# Patient Record
Sex: Male | Born: 1995 | Hispanic: No | Marital: Single | Smoking: Current every day smoker
Health system: Southern US, Community
[De-identification: ages and names within clinical notes are randomized; demographics above are authoritative.]

---

## 2014-06-12 ENCOUNTER — Ambulatory Visit: Payer: Self-pay

## 2014-07-17 ENCOUNTER — Encounter: Payer: Self-pay | Admitting: Family Medicine

## 2014-07-17 ENCOUNTER — Other Ambulatory Visit (HOSPITAL_COMMUNITY)
Admission: RE | Admit: 2014-07-17 | Discharge: 2014-07-17 | Disposition: A | Discharge status: Home / Self Care | Payer: Medicaid Other | Source: Ambulatory Visit | Attending: Family Medicine | Admitting: Family Medicine

## 2014-07-17 ENCOUNTER — Ambulatory Visit (INDEPENDENT_AMBULATORY_CARE_PROVIDER_SITE_OTHER): Payer: Medicaid Other | Admitting: Family Medicine

## 2014-07-17 VITALS — BP 112/61 | HR 64 | Temp 98.2°F | Ht 65.0 in | Wt 148.4 lb

## 2014-07-17 DIAGNOSIS — Z008 Encounter for other general examination: Secondary | ICD-10-CM

## 2014-07-17 DIAGNOSIS — Z716 Tobacco abuse counseling: Secondary | ICD-10-CM | POA: Insufficient documentation

## 2014-07-17 DIAGNOSIS — K089 Disorder of teeth and supporting structures, unspecified: Secondary | ICD-10-CM | POA: Insufficient documentation

## 2014-07-17 DIAGNOSIS — Z0289 Encounter for other administrative examinations: Secondary | ICD-10-CM | POA: Insufficient documentation

## 2014-07-17 DIAGNOSIS — K088 Other specified disorders of teeth and supporting structures: Secondary | ICD-10-CM

## 2014-07-17 DIAGNOSIS — Z603 Acculturation difficulty: Secondary | ICD-10-CM | POA: Insufficient documentation

## 2014-07-17 DIAGNOSIS — Z23 Encounter for immunization: Secondary | ICD-10-CM

## 2014-07-17 DIAGNOSIS — Z113 Encounter for screening for infections with a predominantly sexual mode of transmission: Secondary | ICD-10-CM | POA: Diagnosis not present

## 2014-07-17 LAB — POCT URINALYSIS DIPSTICK
Bilirubin, UA: NEGATIVE
Blood, UA: NEGATIVE
Glucose, UA: NEGATIVE
Ketones, UA: NEGATIVE
Leukocytes, UA: NEGATIVE
NITRITE UA: NEGATIVE
PROTEIN UA: NEGATIVE
SPEC GRAV UA: 1.015
UROBILINOGEN UA: 0.2
pH, UA: 7.5

## 2014-07-17 LAB — CBC WITH DIFFERENTIAL/PLATELET
BASOS ABS: 0.1 10*3/uL (ref 0.0–0.1)
BASOS PCT: 1 % (ref 0–1)
EOS PCT: 4 % (ref 0–5)
Eosinophils Absolute: 0.3 10*3/uL (ref 0.0–0.7)
HEMATOCRIT: 44.8 % (ref 39.0–52.0)
Hemoglobin: 15.4 g/dL (ref 13.0–17.0)
LYMPHS PCT: 36 % (ref 12–46)
Lymphs Abs: 2.6 10*3/uL (ref 0.7–4.0)
MCH: 29.8 pg (ref 26.0–34.0)
MCHC: 34.4 g/dL (ref 30.0–36.0)
MCV: 86.8 fL (ref 78.0–100.0)
MONO ABS: 0.5 10*3/uL (ref 0.1–1.0)
Monocytes Relative: 7 % (ref 3–12)
Neutro Abs: 3.7 10*3/uL (ref 1.7–7.7)
Neutrophils Relative %: 52 % (ref 43–77)
Platelets: 340 10*3/uL (ref 150–400)
RBC: 5.16 MIL/uL (ref 4.22–5.81)
RDW: 12.7 % (ref 11.5–15.5)
WBC: 7.2 10*3/uL (ref 4.0–10.5)

## 2014-07-17 NOTE — Progress Notes (Signed)
Burmese interpreter utilized during today's visit. Interpretation method: in-person present for entire visit  [facility] New Patient Visit  HPI: Patient presents to [facility] today for a new patient appointment to establish general primary care. He has no specific complaints.  ROS: See HPI. Generally feels well.  Immigrant Social History: - Name spelling correctly?: NO. 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  no  Preventative Care History: -Seen at health department?: no -reports he has not had labs or vaccinations but ?does have recorded vaccines in [facility]  Past Medical Hx:  -none known to patient  Past Surgical Hx:  -none  Family Hx: updated in Epic - Number of family members:  4 (mother, father, grandmother, brother) - Number of family members in [place]:  3 (mother, father, grandmother; brother still lives in [place])  PHYSICAL EXAM: BP 112/61  Pulse 64  Temp(Src) 98.2 F (36.8 C) (Oral)  Ht 5\' 5"  (1.651 m)  Wt 148 lb 6.4 oz (67.314 kg)  BMI 24.70 kg/m2 Gen: well-appearing adult male in NAD HEENT: [place] / AT, EOMI, MMM, posterior oropharynx clear, TM's clear Neck:  Supple, no masses, no lymphadenopathy Heart: RRR, no murmur Lungs: CTAB, no wheezes Abdomen: soft, nontender, BS+, no HSM GU:  Circumcised male, no hernias, no scarring  or signs of trauma.  No hernias.  Testicles normal and descended BL Rectal:  No signs of trauma.  Good tone.   Skin:  Warm, dry, intact MSK: no gross deformities noted, normal ROM throughout all ext Neuro: grossly normal exam, strength 5/5 throughout all ext, normal gait / station Psych: mood reported euthymic, appropriate / congruent affect  ASSESSMENT/PLAN:  # Health maintenance:  -flu and HPV vaccines given today -basic labs to be drawn today as pt states he has not been seen at the [facility] (see orders) -need to clarify with [facility] to obtain records if he has been seen  #Name misspelling - will need to be clarified in our system and [facility], as name on Medicaid card is Tommy Williams (not Du)  # See also problem based charting.  Examined and interviewed with Dr. Gwendolyn GrantWalden  FOLLOW UP: F/u in 1 year for wellness check, or sooner as needed.

## 2014-07-17 NOTE — Assessment & Plan Note (Signed)
Advised pt to stop smoking. Pt not prepared to quit / not interested in quitting, at this time.

## 2014-07-17 NOTE — Assessment & Plan Note (Signed)
Provided Medicaid-accepting dentist list. F/u PRN.

## 2014-07-17 NOTE — Assessment & Plan Note (Signed)
Refugee from [place] through camp in [place], requires Burmese interpreter.

## 2014-07-17 NOTE — Patient Instructions (Signed)
Everything looks okay, today. We will test your blood and urine. If anything is abnormal, we will call you to let you know what to do. If everything is normal, we will send you a letter. Come back to see Dr. Gwendolyn GrantWalden in 1 year. You will need to call us next summer to make this appointment. If you need to be seen sooner (if you get sick or feel unwell), make an appointment to come in. Our phone number is [phone].

## 2014-07-18 LAB — HIV ANTIBODY (ROUTINE TESTING W REFLEX): HIV: NONREACTIVE

## 2014-07-18 LAB — COMPREHENSIVE METABOLIC PANEL
ALT: 18 U/L (ref 0–53)
AST: 20 U/L (ref 0–37)
Albumin: 4.5 g/dL (ref 3.5–5.2)
Alkaline Phosphatase: 76 U/L (ref 39–117)
BUN: 10 mg/dL (ref 6–23)
CHLORIDE: 103 meq/L (ref 96–112)
CO2: 28 mEq/L (ref 19–32)
Calcium: 9.4 mg/dL (ref 8.4–10.5)
Creat: 1.03 mg/dL (ref 0.50–1.35)
GLUCOSE: 89 mg/dL (ref 70–99)
Potassium: 4.4 mEq/L (ref 3.5–5.3)
Sodium: 139 mEq/L (ref 135–145)
Total Bilirubin: 0.6 mg/dL (ref 0.2–1.2)
Total Protein: 7.3 g/dL (ref 6.0–8.3)

## 2014-07-18 LAB — SICKLE CELL SCREEN: Sickle Cell Screen: NEGATIVE

## 2014-07-18 LAB — VARICELLA ZOSTER ANTIBODY, IGG: VARICELLA IGG: 280.9 {index} — AB (ref <135.00)

## 2014-07-18 LAB — RPR

## 2014-07-18 LAB — HEPATITIS B SURFACE ANTIGEN: Hepatitis B Surface Ag: NEGATIVE

## 2014-07-19 LAB — QUANTIFERON TB GOLD ASSAY (BLOOD)
INTERFERON GAMMA RELEASE ASSAY: POSITIVE — AB
MITOGEN VALUE: 8.63 [IU]/mL
Quantiferon Nil Value: 0.38 IU/mL
Quantiferon Tb Ag Minus Nil Value: 1.09 IU/mL
TB Ag value: 1.47 IU/mL

## 2014-07-19 LAB — URINE CYTOLOGY ANCILLARY ONLY
Chlamydia: NEGATIVE
Neisseria Gonorrhea: NEGATIVE

## 2014-09-29 ENCOUNTER — Other Ambulatory Visit: Payer: Self-pay | Admitting: Infectious Disease

## 2014-09-29 ENCOUNTER — Ambulatory Visit
Admission: RE | Admit: 2014-09-29 | Discharge: 2014-09-29 | Disposition: A | Discharge status: Home / Self Care | Payer: No Typology Code available for payment source | Source: Ambulatory Visit | Attending: Infectious Disease | Admitting: Infectious Disease

## 2014-09-29 DIAGNOSIS — R7611 Nonspecific reaction to tuberculin skin test without active tuberculosis: Secondary | ICD-10-CM

## 2015-09-15 IMAGING — CR DG CHEST 1V
1 series · 1 of 1 positions shown · non-contrast
Comparison: None.

CLINICAL DATA: Positive PPD.

EXAM:
CHEST - 1 VIEW

[view not recorded]
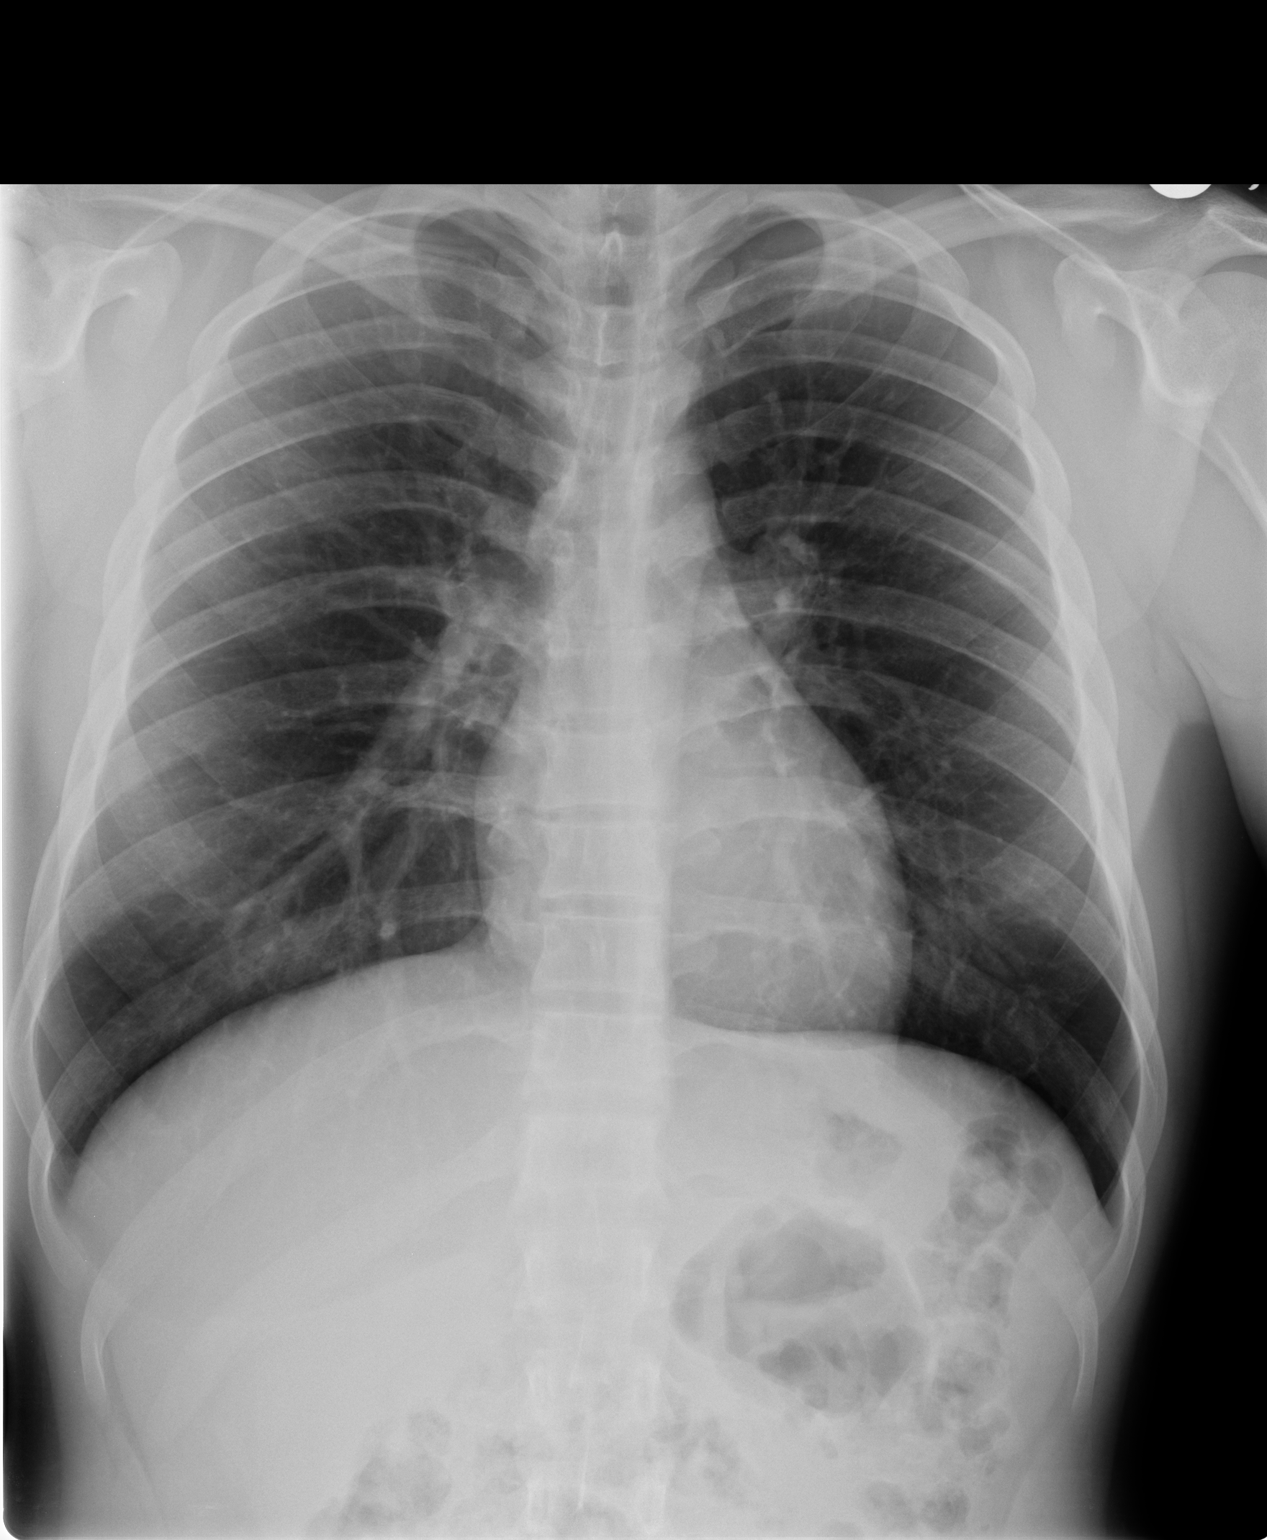

[1 of 1 positions shown; findings below may reference images not displayed]

FINDINGS: The cardiomediastinal contours are normal. No evidence of
adenopathy. The lungs are clear. Pulmonary vasculature is normal. No
consolidation, pleural effusion, or pneumothorax. No acute osseous
abnormalities are seen.
IMPRESSION: Clear lungs.  No evidence of active or latent TB.
